# Patient Record
Sex: Male | Born: 1960 | Race: White | Hispanic: No | Marital: Married
Health system: Southern US, Community
[De-identification: ages and names within clinical notes are randomized; demographics above are authoritative.]

---

## 2009-11-22 ENCOUNTER — Encounter: Admission: RE | Admit: 2009-11-22 | Discharge: 2009-11-22 | Payer: Self-pay

## 2011-08-06 IMAGING — CR DG CERVICAL SPINE 1V
1 series · 1 of 1 positions shown · non-contrast
Comparison: None

CLINICAL DATA: Occipital stimulator.

CERVICAL SPINE - 1 VIEW

[view not recorded]
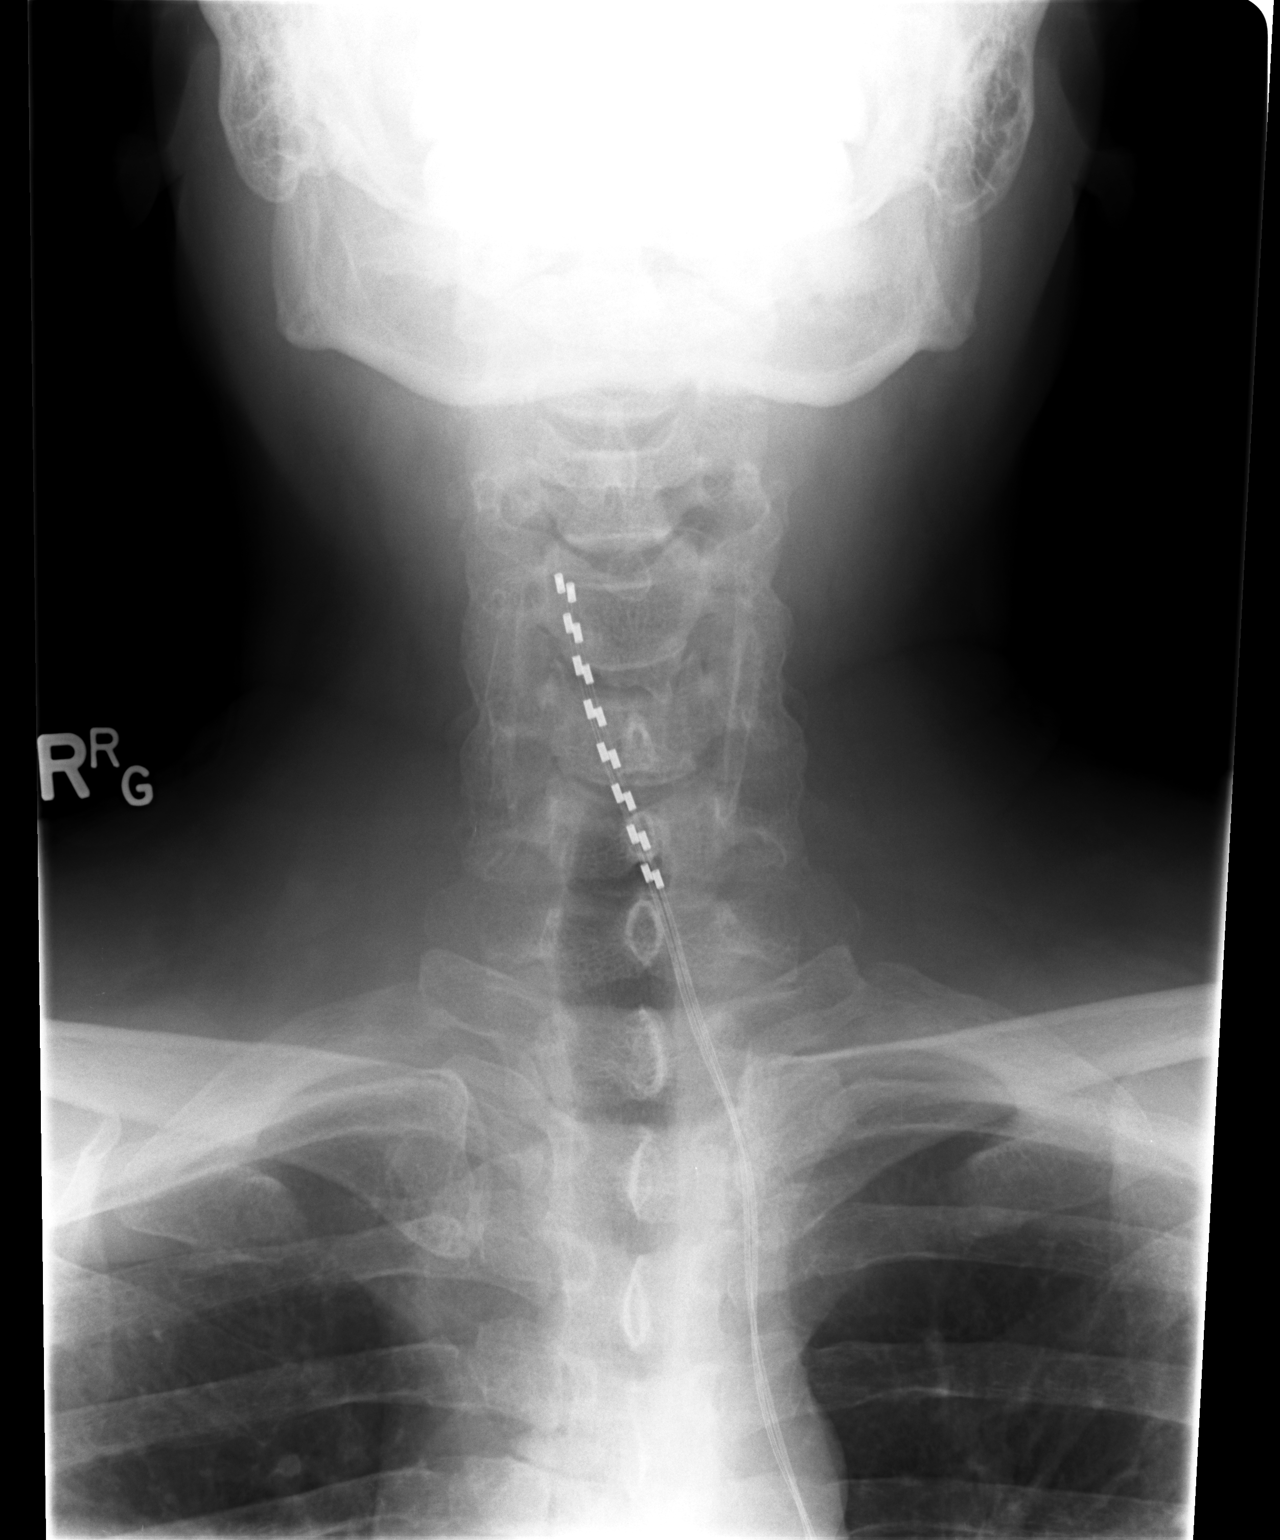

[1 of 1 positions shown; findings below may reference images not displayed]

FINDINGS: A single AP view of the cervical spine was obtained.

There are two leads overlying the cervical spine extending from C4-
C7.  With only a  single view, I cannot determine the depth of
these leads.  The wires extend down into the left chest.  No acute
bony abnormality.
IMPRESSION: Two electrical stimulator leads are seen overlying the cervical
spine.

## 2020-04-15 ENCOUNTER — Telehealth: Payer: Self-pay | Admitting: Unknown Physician Specialty

## 2020-04-15 ENCOUNTER — Telehealth (HOSPITAL_COMMUNITY): Payer: Self-pay

## 2020-04-15 NOTE — Telephone Encounter (Signed)
Contacted patient that we are prioritizing patients who lived in [place], [place] or [place] due to medication shortage in our clinic. Patient resided in [place].   Spoke with patient's wife and she informed me patient is feeling weak and his oxygen levels drop in the 80s while laying down but in the low to mid 90s when sitting. She states patient has an appointment in [place] for MAB infusion for Monday. I recommended patient to go to the ED to have patient get evaluated. Patient is not SOB or no chest pain. Patient states they will head to [facility] ED tonight.

## 2020-04-15 NOTE — Telephone Encounter (Signed)
Called to Discuss with patient about Covid symptoms and the use of the monoclonal antibody infusion for those with mild to moderate Covid symptoms and at a high risk of hospitalization.     Pt appears to qualify for this infusion due to co-morbid conditions and/or a member of an at-risk group in accordance with the FDA Emergency Use Authorization.    Unable to reach pt
